# Patient Record
Sex: Male | Born: 2000 | Race: Black or African American | Hispanic: No | Marital: Single | Smoking: Never smoker
Health system: Southern US, Community
[De-identification: ages and names within clinical notes are randomized; demographics above are authoritative.]

---
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
## 2021-01-21 ENCOUNTER — Encounter (HOSPITAL_COMMUNITY): Payer: Self-pay | Admitting: Emergency Medicine

## 2021-01-21 ENCOUNTER — Emergency Department (HOSPITAL_COMMUNITY)
Admission: EM | Admit: 2021-01-21 | Discharge: 2021-01-21 | Disposition: A | Discharge status: Home / Self Care | Payer: Medicaid Other | Attending: Emergency Medicine | Admitting: Emergency Medicine

## 2021-01-21 DIAGNOSIS — S6991XA Unspecified injury of right wrist, hand and finger(s), initial encounter: Secondary | ICD-10-CM | POA: Diagnosis present

## 2021-01-21 DIAGNOSIS — Z5321 Procedure and treatment not carried out due to patient leaving prior to being seen by health care provider: Secondary | ICD-10-CM | POA: Insufficient documentation

## 2021-01-21 DIAGNOSIS — W010XXA Fall on same level from slipping, tripping and stumbling without subsequent striking against object, initial encounter: Secondary | ICD-10-CM | POA: Diagnosis not present

## 2021-01-21 MED ORDER — TETANUS-DIPHTH-ACELL PERTUSSIS 5-2.5-18.5 LF-MCG/0.5 IM SUSY: 0.5000 mL | PREFILLED_SYRINGE | Freq: Once | INTRAMUSCULAR | Status: DC

## 2021-01-21 NOTE — ED Provider Notes (Signed)
Emergency Medicine Provider Triage Evaluation Note  Hunter Sanchez , a 20 y.o. male  was evaluated in triage.  Pt complains of hand injury.  Review of Systems  Positive: Right hand pain, falling Negative: Numbness, wrist pain  Physical Exam  BP (!) 145/96 (BP Location: Left Arm)   Pulse (!) 57   Temp 97.7 F (36.5 C) (Oral)   Resp 14   SpO2 100%  Gen:   Awake, no distress   Resp:  Normal effort  MSK:   Moves extremities without difficulty  Other:  R hand: skin tear to palm of hand, no fb noted.  Faint brusing noted with ttp.  R wrist nontender  Medical Decision Making  Medically screening exam initiated at 1:47 PM.  Appropriate orders placed.  Hunter Sanchez was informed that the remainder of the evaluation will be completed by another provider, this initial triage assessment does not replace that evaluation, and the importance of remaining in the ED until their evaluation is complete.  Pt tripped and fell  2 days ago, injured R hand when it hits the ground.  Had skin tear, not UTD with tdap.     Fayrene Helper, PA-C 01/21/21 1348    Pricilla Loveless, MD 01/21/21 [phone]

## 2021-01-21 NOTE — ED Notes (Signed)
Patient walked out of the emergency room, this tech asked patient if he was stepping outside, patient said hes not waiting and left the emergency room.

## 2021-01-21 NOTE — ED Triage Notes (Signed)
Patient complains of right palm pain that started two days ago after he tripped while running and caught himself with the right hand against the road. 2 cm by 2 cm abrasion to right palm.

## 2023-04-06 ENCOUNTER — Encounter (HOSPITAL_BASED_OUTPATIENT_CLINIC_OR_DEPARTMENT_OTHER): Payer: Self-pay

## 2023-04-06 ENCOUNTER — Emergency Department (HOSPITAL_BASED_OUTPATIENT_CLINIC_OR_DEPARTMENT_OTHER)
Admission: EM | Admit: 2023-04-06 | Discharge: 2023-04-06 | Discharge status: Against Medical Advice | Payer: Medicaid Other | Attending: Emergency Medicine | Admitting: Emergency Medicine

## 2023-04-06 ENCOUNTER — Other Ambulatory Visit: Payer: Self-pay

## 2023-04-06 DIAGNOSIS — Z5321 Procedure and treatment not carried out due to patient leaving prior to being seen by health care provider: Secondary | ICD-10-CM | POA: Diagnosis not present

## 2023-04-06 DIAGNOSIS — M545 Low back pain, unspecified: Secondary | ICD-10-CM | POA: Insufficient documentation

## 2023-04-06 NOTE — ED Triage Notes (Signed)
Pt reports back pain 2 years which has gotten over the last 2 days. Pain mid to lower back. Pt reports worse at night when in bed

## 2023-07-26 ENCOUNTER — Emergency Department (HOSPITAL_COMMUNITY)
Admission: EM | Admit: 2023-07-26 | Discharge: 2023-07-26 | Discharge status: Against Medical Advice | Payer: Self-pay | Attending: Emergency Medicine | Admitting: Emergency Medicine

## 2023-07-26 ENCOUNTER — Other Ambulatory Visit: Payer: Self-pay

## 2023-07-26 ENCOUNTER — Encounter (HOSPITAL_COMMUNITY): Payer: Self-pay

## 2023-07-26 DIAGNOSIS — Y9302 Activity, running: Secondary | ICD-10-CM | POA: Insufficient documentation

## 2023-07-26 DIAGNOSIS — Z5321 Procedure and treatment not carried out due to patient leaving prior to being seen by health care provider: Secondary | ICD-10-CM | POA: Insufficient documentation

## 2023-07-26 DIAGNOSIS — S0993XA Unspecified injury of face, initial encounter: Secondary | ICD-10-CM | POA: Insufficient documentation

## 2023-07-26 DIAGNOSIS — W228XXA Striking against or struck by other objects, initial encounter: Secondary | ICD-10-CM | POA: Insufficient documentation

## 2023-07-26 DIAGNOSIS — R519 Headache, unspecified: Secondary | ICD-10-CM | POA: Insufficient documentation

## 2023-07-26 MED ORDER — TETANUS-DIPHTH-ACELL PERTUSSIS 5-2.5-18.5 LF-MCG/0.5 IM SUSY: 0.5000 mL | PREFILLED_SYRINGE | Freq: Once | INTRAMUSCULAR | Status: DC

## 2023-07-26 NOTE — ED Notes (Signed)
 Pt left lobby no longer in ED

## 2023-07-26 NOTE — ED Notes (Signed)
 Unable to locate pt, no answer when called from the waiting room.

## 2023-07-26 NOTE — ED Provider Triage Note (Signed)
 Emergency Medicine Provider Triage Evaluation Note  Hunter Sanchez , a 23 y.o. male  was evaluated in triage.  Pt complains of facial injury.  States same occurred yesterday evening when he was running in the house and ran into a wall.  He did not lose consciousness.  Notes that he was having a substantial amount of bleeding from his nose as well as pain and what he feels like is a deformity at the bridge of his nose.  Presents with concern for same.  Also notes a headache.  Denies any vision changes, painful EOMs, or difficulty opening or closing his jaw.  Review of Systems  Positive:  Negative:   Physical Exam  BP (!) 133/93 (BP Location: Left Arm)   Pulse 75   Temp 98 F (36.7 C) (Oral)   Resp 16   Ht 5\' 8"  (1.727 m)   Wt 64 kg   SpO2 98%   BMI 21.44 kg/m  Gen:   Awake, no distress   Resp:  Normal effort  MSK:   Moves extremities without difficulty  Other:  Small wound noted to the bridge of the nose, EOMs intact without pain  Medical Decision Making  Medically screening exam initiated at 12:29 PM.  Appropriate orders placed.  Hunter Sanchez was informed that the remainder of the evaluation will be completed by another provider, this initial triage assessment does not replace that evaluation, and the importance of remaining in the ED until their evaluation is complete.    Silva Bandy, Cordelia Poche 07/26/23 1232

## 2023-07-26 NOTE — ED Triage Notes (Signed)
 Patient reports he ran into a wall last night and injured his nose.  Swelling noted.  Reports had pain all night and difficulty breathing out his nose.
